# Patient Record
Sex: Male | Born: 2000 | Race: Black or African American | Hispanic: No | Marital: Single | Smoking: Never smoker
Health system: Southern US, Community
[De-identification: ages and names within clinical notes are randomized; demographics above are authoritative.]

## PROBLEM LIST (undated history)

## (undated) ENCOUNTER — Ambulatory Visit: Source: Home / Self Care

## (undated) DIAGNOSIS — J45909 Unspecified asthma, uncomplicated: Secondary | ICD-10-CM

## (undated) DIAGNOSIS — T7840XA Allergy, unspecified, initial encounter: Secondary | ICD-10-CM

## (undated) DIAGNOSIS — F909 Attention-deficit hyperactivity disorder, unspecified type: Secondary | ICD-10-CM

## (undated) HISTORY — DX: Allergy, unspecified, initial encounter: T78.40XA

## (undated) HISTORY — DX: Unspecified asthma, uncomplicated: J45.909

## (undated) HISTORY — DX: Attention-deficit hyperactivity disorder, unspecified type: F90.9

---
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
## 2021-01-13 DIAGNOSIS — Z03818 Encounter for observation for suspected exposure to other biological agents ruled out: Secondary | ICD-10-CM | POA: Diagnosis not present

## 2021-01-31 DIAGNOSIS — Z03818 Encounter for observation for suspected exposure to other biological agents ruled out: Secondary | ICD-10-CM | POA: Diagnosis not present

## 2021-02-15 DIAGNOSIS — Z20828 Contact with and (suspected) exposure to other viral communicable diseases: Secondary | ICD-10-CM | POA: Diagnosis not present

## 2021-03-13 DIAGNOSIS — J111 Influenza due to unidentified influenza virus with other respiratory manifestations: Secondary | ICD-10-CM | POA: Diagnosis not present

## 2021-04-17 DIAGNOSIS — J111 Influenza due to unidentified influenza virus with other respiratory manifestations: Secondary | ICD-10-CM | POA: Diagnosis not present

## 2021-05-14 DIAGNOSIS — J111 Influenza due to unidentified influenza virus with other respiratory manifestations: Secondary | ICD-10-CM | POA: Diagnosis not present

## 2021-07-16 ENCOUNTER — Encounter (HOSPITAL_COMMUNITY): Payer: Self-pay

## 2021-07-16 ENCOUNTER — Other Ambulatory Visit: Payer: Self-pay

## 2021-07-16 ENCOUNTER — Emergency Department (HOSPITAL_COMMUNITY)
Admission: EM | Admit: 2021-07-16 | Discharge: 2021-07-16 | Discharge status: Against Medical Advice | Payer: Medicaid Other | Attending: Emergency Medicine | Admitting: Emergency Medicine

## 2021-07-16 DIAGNOSIS — R0602 Shortness of breath: Secondary | ICD-10-CM | POA: Diagnosis not present

## 2021-07-16 DIAGNOSIS — Z5321 Procedure and treatment not carried out due to patient leaving prior to being seen by health care provider: Secondary | ICD-10-CM | POA: Diagnosis not present

## 2021-07-16 DIAGNOSIS — J45901 Unspecified asthma with (acute) exacerbation: Secondary | ICD-10-CM | POA: Diagnosis not present

## 2021-07-16 DIAGNOSIS — R509 Fever, unspecified: Secondary | ICD-10-CM | POA: Insufficient documentation

## 2021-07-16 DIAGNOSIS — R112 Nausea with vomiting, unspecified: Secondary | ICD-10-CM | POA: Diagnosis not present

## 2021-07-16 DIAGNOSIS — Z20822 Contact with and (suspected) exposure to covid-19: Secondary | ICD-10-CM | POA: Insufficient documentation

## 2021-07-16 LAB — RESP PANEL BY RT-PCR (FLU A&B, COVID) ARPGX2
Influenza A by PCR: NEGATIVE
Influenza B by PCR: NEGATIVE
SARS Coronavirus 2 by RT PCR: NEGATIVE

## 2021-07-16 MED ORDER — ALBUTEROL SULFATE HFA 108 (90 BASE) MCG/ACT IN AERS
2.0000 | INHALATION_SPRAY | RESPIRATORY_TRACT | Status: DC | PRN
  Administered 2021-07-16: 2 via RESPIRATORY_TRACT
  Filled 2021-07-16: qty 6.7

## 2021-07-16 NOTE — ED Triage Notes (Signed)
Pt reports fever/chills, vomiting, nausea onset 2 days ago and states is it making his asthma worse ?

## 2021-07-16 NOTE — ED Notes (Signed)
Pt left. Pt advised to stay. Pt stated he would come back tomorrow.  ?

## 2021-07-16 NOTE — ED Provider Triage Note (Signed)
Emergency Medicine Provider Triage Evaluation Note ? ?Wesley Conner , a 21 y.o. male  was evaluated in triage.  Pt complains of asthma exacerbation for the past two days. He reports some nausea and vomiting, some post-tussive emesis but not always. Denies any abdominal pain. Reports subjective fevers.  ? ?Review of Systems  ?Positive:  ?Negative:  ? ?Physical Exam  ?BP 116/86 (BP Location: Left Arm)   Pulse (!) 101   Temp 98.9 ?F (37.2 ?C) (Oral)   Resp 16   SpO2 97%  ?Gen:   Awake, no distress   ?Resp:  Normal effort, bilateral expiratory wheezing  ?MSK:   Moves extremities without difficulty  ?Other:   ? ?Medical Decision Making  ?Medically screening exam initiated at 8:46 PM.  Appropriate orders placed.  Wesley Conner was informed that the remainder of the evaluation will be completed by another provider, this initial triage assessment does not replace that evaluation, and the importance of remaining in the ED until their evaluation is complete. ? ?Likely asthma exacerbation from viral illness.COVID/flu ordered with albuterol. NAD. ?  ?Sherrell Puller, PA-C ?07/16/21 2048 ? ?

## 2021-07-25 IMAGING — DX DG ANKLE COMPLETE 3+V*L*
3 series · 3 of 3 positions shown · non-contrast
Comparison: None.

CLINICAL DATA: Ankle sprain while playing basketball. Lateral pain.

EXAM:
LEFT ANKLE COMPLETE - 3+ VIEW

[ankle ap]
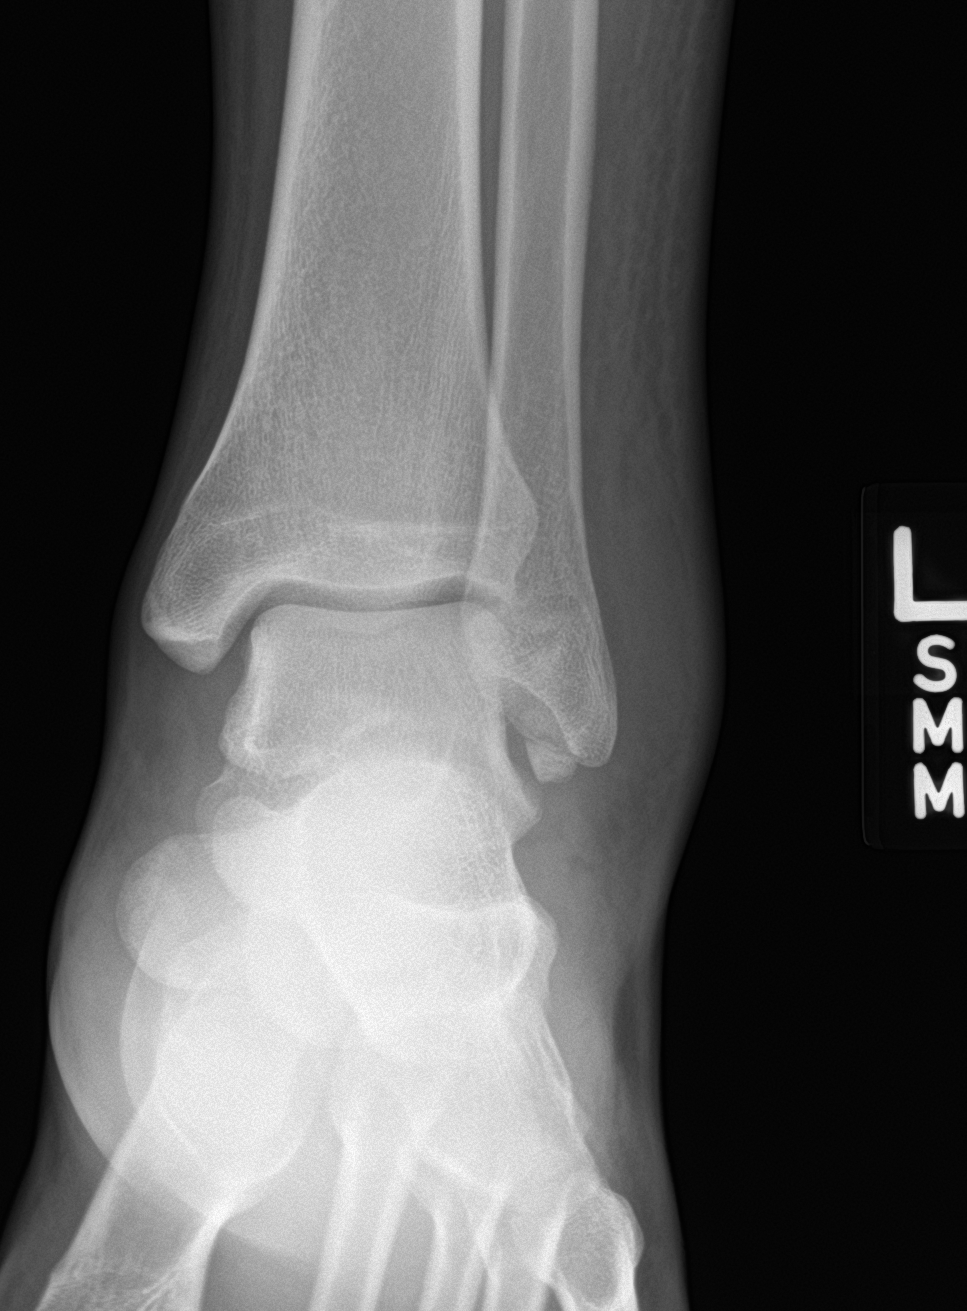

[ankle obl]
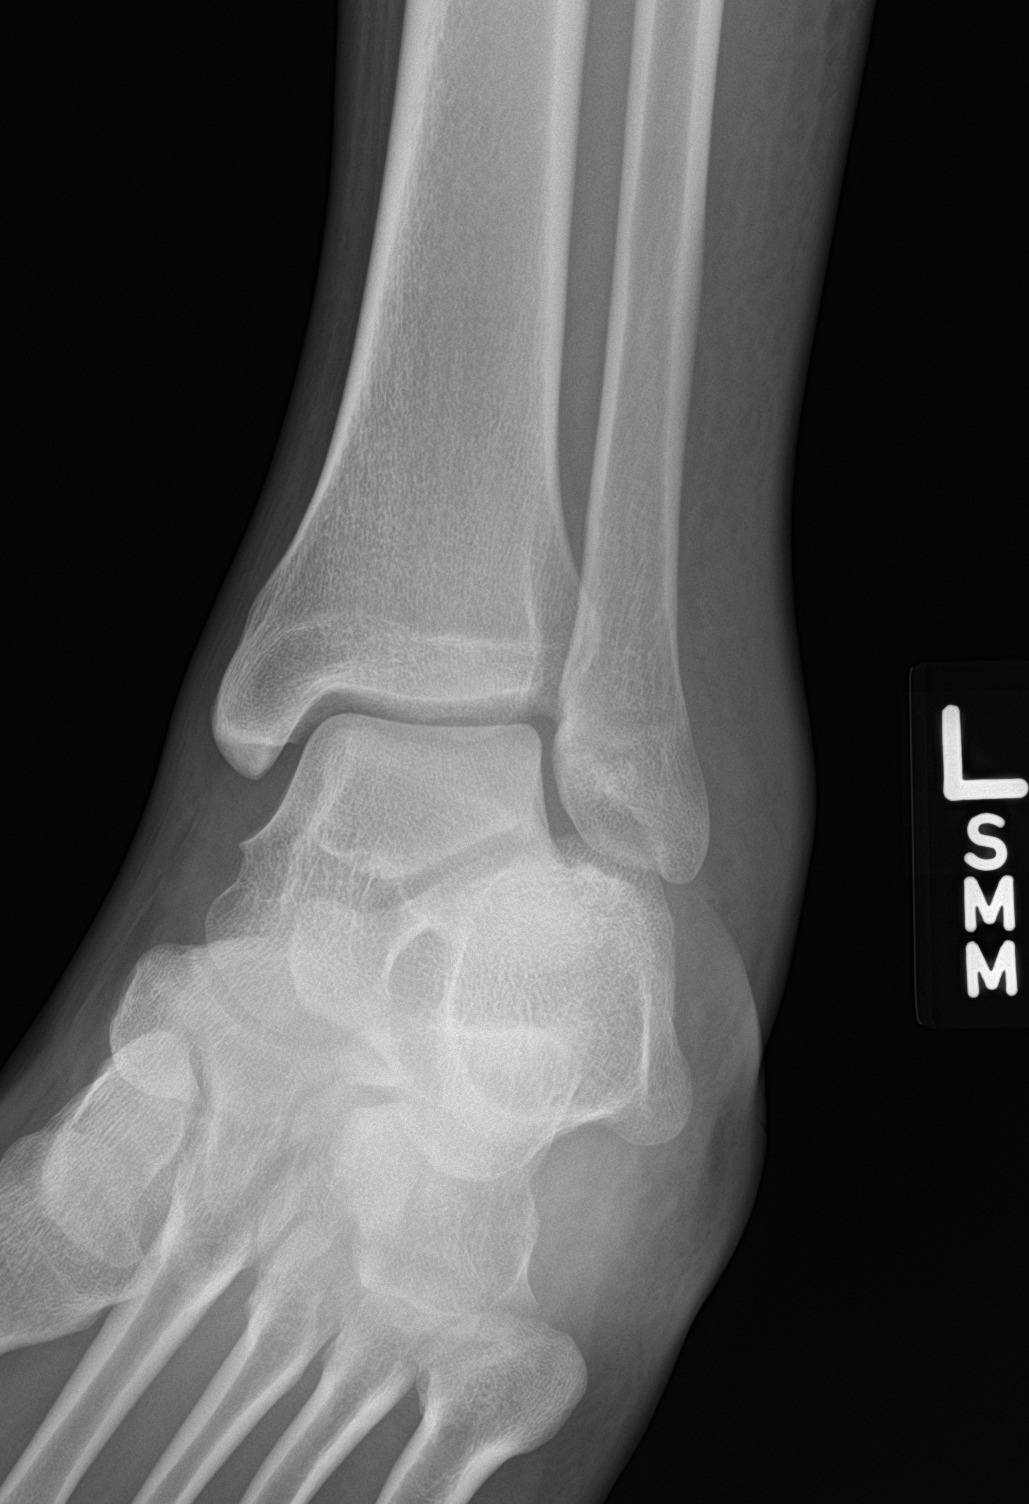

[ankle lat]
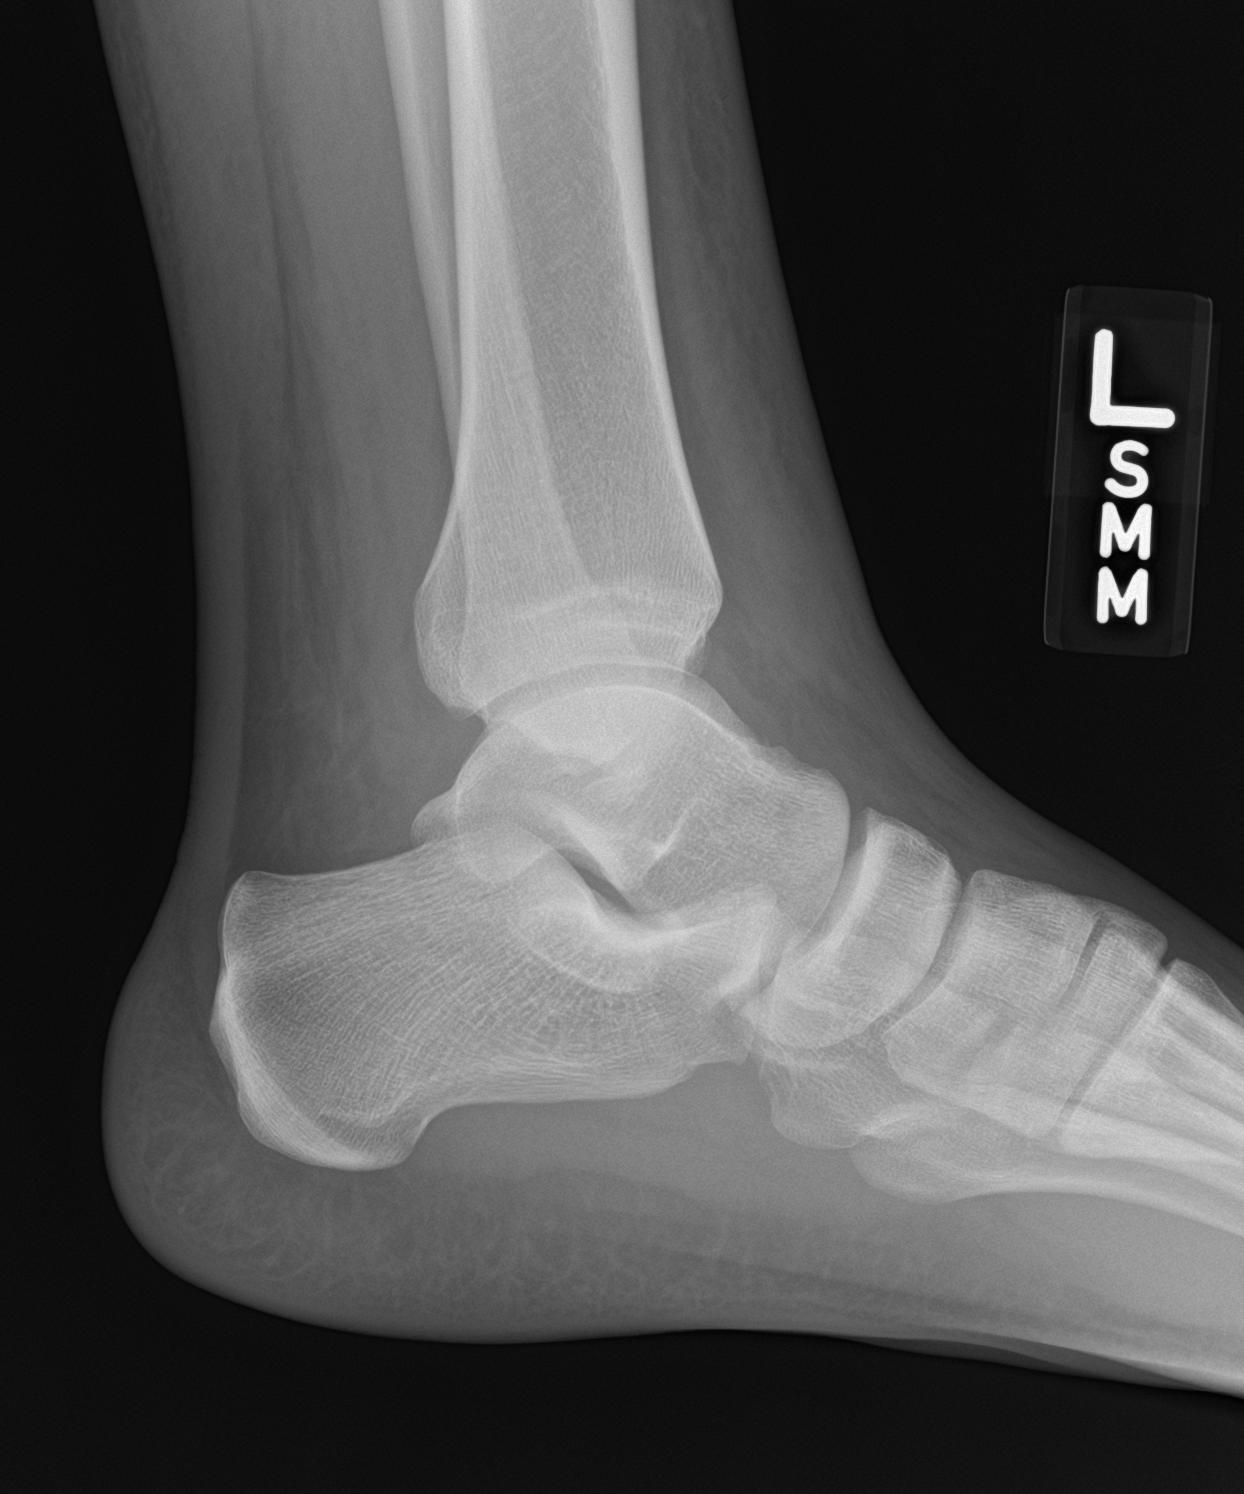

[3 of 3 positions shown; findings below may reference images not displayed]

FINDINGS: Nondisplaced fracture of the distal tip of the lateral malleolus
with overlying soft tissue swelling. No other fracture or
dislocation. No aggressive osseous lesion.
IMPRESSION: Nondisplaced fracture of the distal tip of the lateral malleolus
with overlying soft tissue swelling.

## 2021-08-12 ENCOUNTER — Encounter: Payer: Self-pay | Admitting: Pediatrics

## 2021-08-12 ENCOUNTER — Telehealth: Payer: Self-pay

## 2021-08-12 NOTE — Telephone Encounter (Signed)
Called to schedule for PE/asthma f/u but no answer and voice mail was not available.

## 2021-10-19 ENCOUNTER — Other Ambulatory Visit (HOSPITAL_COMMUNITY): Payer: Self-pay

## 2021-10-19 ENCOUNTER — Emergency Department (HOSPITAL_COMMUNITY)
Admission: EM | Admit: 2021-10-19 | Discharge: 2021-10-19 | Disposition: A | Discharge status: Home / Self Care | Payer: Medicaid Other | Attending: Emergency Medicine | Admitting: Emergency Medicine

## 2021-10-19 ENCOUNTER — Emergency Department (HOSPITAL_COMMUNITY): Payer: Medicaid Other

## 2021-10-19 ENCOUNTER — Encounter (HOSPITAL_COMMUNITY): Payer: Self-pay | Admitting: Emergency Medicine

## 2021-10-19 DIAGNOSIS — R112 Nausea with vomiting, unspecified: Secondary | ICD-10-CM

## 2021-10-19 DIAGNOSIS — R824 Acetonuria: Secondary | ICD-10-CM | POA: Insufficient documentation

## 2021-10-19 DIAGNOSIS — I10 Essential (primary) hypertension: Secondary | ICD-10-CM | POA: Diagnosis not present

## 2021-10-19 DIAGNOSIS — R110 Nausea: Secondary | ICD-10-CM | POA: Diagnosis not present

## 2021-10-19 DIAGNOSIS — J45909 Unspecified asthma, uncomplicated: Secondary | ICD-10-CM | POA: Diagnosis not present

## 2021-10-19 DIAGNOSIS — R809 Proteinuria, unspecified: Secondary | ICD-10-CM | POA: Diagnosis not present

## 2021-10-19 DIAGNOSIS — R1111 Vomiting without nausea: Secondary | ICD-10-CM | POA: Diagnosis not present

## 2021-10-19 DIAGNOSIS — E8809 Other disorders of plasma-protein metabolism, not elsewhere classified: Secondary | ICD-10-CM | POA: Diagnosis not present

## 2021-10-19 DIAGNOSIS — R7981 Abnormal blood-gas level: Secondary | ICD-10-CM | POA: Diagnosis not present

## 2021-10-19 DIAGNOSIS — D72829 Elevated white blood cell count, unspecified: Secondary | ICD-10-CM | POA: Diagnosis not present

## 2021-10-19 DIAGNOSIS — R1084 Generalized abdominal pain: Secondary | ICD-10-CM | POA: Insufficient documentation

## 2021-10-19 DIAGNOSIS — R944 Abnormal results of kidney function studies: Secondary | ICD-10-CM | POA: Insufficient documentation

## 2021-10-19 DIAGNOSIS — R109 Unspecified abdominal pain: Secondary | ICD-10-CM | POA: Diagnosis not present

## 2021-10-19 LAB — COMPREHENSIVE METABOLIC PANEL
ALT: 24 U/L (ref 0–44)
AST: 36 U/L (ref 15–41)
Albumin: 5.2 g/dL — ABNORMAL HIGH (ref 3.5–5.0)
Alkaline Phosphatase: 65 U/L (ref 38–126)
Anion gap: 17 — ABNORMAL HIGH (ref 5–15)
BUN: 25 mg/dL — ABNORMAL HIGH (ref 6–20)
CO2: 21 mmol/L — ABNORMAL LOW (ref 22–32)
Calcium: 9.9 mg/dL (ref 8.9–10.3)
Chloride: 104 mmol/L (ref 98–111)
Creatinine, Ser: 0.99 mg/dL (ref 0.61–1.24)
GFR, Estimated: >60 mL/min (ref >60)
Glucose, Bld: 83 mg/dL (ref 70–99)
Potassium: 4.6 mmol/L (ref 3.5–5.1)
Sodium: 142 mmol/L (ref 135–145)
Total Bilirubin: 1.2 mg/dL (ref 0.3–1.2)
Total Protein: 9.2 g/dL — ABNORMAL HIGH (ref 6.5–8.1)

## 2021-10-19 LAB — CBC WITH DIFFERENTIAL/PLATELET
Abs Immature Granulocytes: 0.39 10*3/uL — ABNORMAL HIGH (ref 0.00–0.07)
Basophils Absolute: 0.1 10*3/uL (ref 0.0–0.1)
Basophils Relative: 0 %
Eosinophils Absolute: 0 10*3/uL (ref 0.0–0.5)
Eosinophils Relative: 0 %
HCT: 52.7 % — ABNORMAL HIGH (ref 39.0–52.0)
Hemoglobin: 17.3 g/dL — ABNORMAL HIGH (ref 13.0–17.0)
Immature Granulocytes: 2 %
Lymphocytes Relative: 8 %
Lymphs Abs: 1.5 10*3/uL (ref 0.7–4.0)
MCH: 29 pg (ref 26.0–34.0)
MCHC: 32.8 g/dL (ref 30.0–36.0)
MCV: 88.4 fL (ref 80.0–100.0)
Monocytes Absolute: 1 10*3/uL (ref 0.1–1.0)
Monocytes Relative: 6 %
Neutro Abs: 15 10*3/uL — ABNORMAL HIGH (ref 1.7–7.7)
Neutrophils Relative %: 84 %
Platelets: 257 10*3/uL (ref 150–400)
RBC: 5.96 MIL/uL — ABNORMAL HIGH (ref 4.22–5.81)
RDW: 11.9 % (ref 11.5–15.5)
WBC: 18 10*3/uL — ABNORMAL HIGH (ref 4.0–10.5)
nRBC: 0 % (ref 0.0–0.2)

## 2021-10-19 LAB — URINALYSIS, ROUTINE W REFLEX MICROSCOPIC
Bilirubin Urine: NEGATIVE
Glucose, UA: NEGATIVE mg/dL
Hgb urine dipstick: NEGATIVE
Ketones, ur: 80 mg/dL — AB
Leukocytes,Ua: NEGATIVE
Nitrite: NEGATIVE
Protein, ur: 100 mg/dL — AB
Specific Gravity, Urine: 1.024 (ref 1.005–1.030)
pH: 5 (ref 5.0–8.0)

## 2021-10-19 LAB — ETHANOL: Alcohol, Ethyl (B): <10 mg/dL (ref <10)

## 2021-10-19 LAB — LIPASE, BLOOD: Lipase: 23 U/L (ref 11–51)

## 2021-10-19 MED ORDER — MORPHINE SULFATE (PF) 2 MG/ML IV SOLN
2.0000 mg | Freq: Once | INTRAVENOUS | Status: AC
  Administered 2021-10-19: 2 mg via INTRAVENOUS
  Filled 2021-10-19: qty 1

## 2021-10-19 MED ORDER — SODIUM CHLORIDE 0.9 % IV BOLUS
1000.0000 mL | Freq: Once | INTRAVENOUS | Status: AC
  Administered 2021-10-19: 1000 mL via INTRAVENOUS

## 2021-10-19 MED ORDER — IOHEXOL 300 MG/ML  SOLN
100.0000 mL | Freq: Once | INTRAMUSCULAR | Status: AC | PRN
  Administered 2021-10-19: 80 mL via INTRAVENOUS

## 2021-10-19 MED ORDER — ONDANSETRON 4 MG PO TBDP
4.0000 mg | ORAL_TABLET | Freq: Three times a day (TID) | ORAL | 0 refills | Status: AC | PRN
  Filled 2021-10-19: qty 20, 7d supply, fill #0

## 2021-10-19 MED ORDER — IBUPROFEN 800 MG PO TABS
800.0000 mg | ORAL_TABLET | Freq: Once | ORAL | Status: AC
  Administered 2021-10-19: 800 mg via ORAL
  Filled 2021-10-19: qty 1

## 2021-10-19 MED ORDER — ONDANSETRON HCL 4 MG/2ML IJ SOLN
4.0000 mg | Freq: Once | INTRAMUSCULAR | Status: AC
  Administered 2021-10-19: 4 mg via INTRAVENOUS
  Filled 2021-10-19: qty 2

## 2021-10-19 NOTE — ED Provider Notes (Signed)
[facility]-EMERGENCY DEPT Provider Note   CSN: [id number] Arrival date & time: 10/19/21  0654     History  Chief Complaint  Patient presents with   Abdominal Pain    Wesley Conner is a 21 y.o. male.  Patient with history of ADHD and asthma presents today with complaints of abdominal pain, nausea, and vomiting. He states that yesterday he had a bottle of vodka, a bottle of tequila, and 2 beers throughout the day yesterday. States that he doesn't normally drink alcohol. After several hours of drinking he developed symptoms that have been persistent since onset. Denies any hematemesis. Abdominal pain is nonlocalized throughout his abdomen. Denies any hx of abdominal surgeries.  The history is provided by the patient. No language interpreter was used.  Abdominal Pain Associated symptoms: nausea and vomiting        Home Medications Prior to Admission medications   Medication Sig Start Date End Date Taking? Authorizing Provider  albuterol (VENTOLIN HFA) 108 (90 Base) MCG/ACT inhaler Inhale 2 puffs into the lungs every 4 (four) hours as needed for wheezing or shortness of breath. Please dispense brand covered by insurance 09/19/20   Maree Erie, MD  cetirizine (ZYRTEC) 10 MG tablet TAKE ONE TABLET BY MOUTH ONCE DAILY AT BEDTIME FOR ALLERGY SYMPTOM CONTROL 11/12/19   Maree Erie, MD  EPINEPHrine 0.3 mg/0.3 mL IJ SOAJ injection Inject contents of one device (0.3 ml) into lateral thigh muscle in event of anaphylaxis 01/12/17   Maree Erie, MD  fluticasone (FLONASE) 50 MCG/ACT nasal spray TAKE 1 SPRAY IN EACH NOSTRIL 1XDAY. RINSE MOUTH+SPIT AFTER USE 09/19/20   Maree Erie, MD  Olopatadine HCl (PATADAY) 0.2 % SOLN Put one drop in affected eye once a day for allergy symptom control 09/19/20   Maree Erie, MD      Allergies    Shellfish allergy    Review of Systems   Review of Systems  Gastrointestinal:  Positive for abdominal pain, nausea and  vomiting.  All other systems reviewed and are negative.   Physical Exam Updated Vital Signs BP 132/82 (BP Location: Right Arm)   Pulse 70   Temp 97.9 F (36.6 C) (Oral)   Resp 18   SpO2 96%  Physical Exam Vitals and nursing note reviewed.  Constitutional:      General: He is not in acute distress.    Appearance: Normal appearance. He is normal weight. He is not ill-appearing, toxic-appearing or diaphoretic.  HENT:     Head: Normocephalic and atraumatic.  Cardiovascular:     Rate and Rhythm: Normal rate.  Pulmonary:     Effort: Pulmonary effort is normal. No respiratory distress.  Abdominal:     General: Abdomen is flat.     Palpations: Abdomen is soft.     Tenderness: There is generalized abdominal tenderness.  Musculoskeletal:        General: Normal range of motion.     Cervical back: Normal range of motion.  Skin:    General: Skin is warm and dry.  Neurological:     General: No focal deficit present.     Mental Status: He is alert.  Psychiatric:        Mood and Affect: Mood normal.        Behavior: Behavior normal.     ED Results / Procedures / Treatments   Labs (all labs ordered are listed, but only abnormal results are displayed) Labs Reviewed  CBC WITH DIFFERENTIAL/PLATELET - Abnormal;  Notable for the following components:      Result Value   WBC 18.0 (*)    RBC 5.96 (*)    Hemoglobin 17.3 (*)    HCT 52.7 (*)    Neutro Abs 15.0 (*)    Abs Immature Granulocytes 0.39 (*)    All other components within normal limits  URINALYSIS, ROUTINE W REFLEX MICROSCOPIC - Abnormal; Notable for the following components:   Ketones, ur 80 (*)    Protein, ur 100 (*)    Bacteria, UA RARE (*)    All other components within normal limits  COMPREHENSIVE METABOLIC PANEL  LIPASE, BLOOD  ETHANOL    EKG None  Radiology CT ABDOMEN PELVIS W CONTRAST  Result Date: 10/19/2021 CLINICAL DATA:  21 year old male with abdominal pain since last night. EXAM: CT ABDOMEN AND PELVIS  WITH CONTRAST TECHNIQUE: Multidetector CT imaging of the abdomen and pelvis was performed using the standard protocol following bolus administration of intravenous contrast. RADIATION DOSE REDUCTION: This exam was performed according to the departmental dose-optimization program which includes automated exposure control, adjustment of the mA and/or kV according to patient size and/or use of iterative reconstruction technique. CONTRAST:  69mL OMNIPAQUE IOHEXOL 300 MG/ML  SOLN COMPARISON:  None Available. FINDINGS: Lower chest: Negative. Hepatobiliary: Negative liver and gallbladder. Pancreas: Negative. Spleen: Negative. Adrenals/Urinary Tract: Negative. Renal enhancement symmetric and within normal limits. Stomach/Bowel: Mostly decompressed large bowel throughout the abdomen and pelvis. Normal appendix, terminates in the right presacral space coronal image 82. No dilated small bowel. Stomach and duodenum appear negative. No free air or free fluid. Vascular/Lymphatic: Major arterial structures and portal venous system in the abdomen and pelvis appear patent, unremarkable. No calcified atherosclerosis lymphadenopathy identified. Reproductive: Negative. Other: No pelvic free fluid. Musculoskeletal: Negative. IMPRESSION: Essentially negative. No acute or inflammatory process identified in the abdomen or pelvis. Electronically Signed   By: Odessa Fleming M.D.   On: 10/19/2021 11:04    Procedures Procedures    Medications Ordered in ED Medications  sodium chloride 0.9 % bolus 1,000 mL (0 mLs Intravenous Stopped 10/19/21 0843)  ondansetron (ZOFRAN) injection 4 mg (4 mg Intravenous Given 10/19/21 0804)  morphine (PF) 2 MG/ML injection 2 mg (2 mg Intravenous Given 10/19/21 0803)    ED Course/ Medical Decision Making/ A&P                           Medical Decision Making Amount and/or Complexity of Data Reviewed Labs: ordered. Radiology: ordered.  Risk Prescription drug management.   This patient presents to the  ED for concern of abdominal pain, nausea, and vomiting, this involves an extensive number of treatment options, and is a complaint that carries with it a high risk of complications and morbidity.    Co morbidities that complicate the patient evaluation  none   Lab Tests:  I Ordered, and personally interpreted labs.  The pertinent results include:  bicarb 21, BUN 25, total protein 9.2, albumin 5.2, anion gap 17. WBC 18. Ketonuria and proteinuria   Imaging Studies ordered:  I ordered imaging studies including CT abdomen pelvis with contrast  I independently visualized and interpreted imaging which showed no acute findings I agree with the radiologist interpretation   Problem List / ED Course / Critical interventions / Medication management  I ordered medication including ibuprofen, morphine, zofran, and fluids  for abdominal pain, nausea and vomiting  Reevaluation of the patient after these medicines showed that the patient improved I  have reviewed the patients home medicines and have made adjustments as needed  Test / Admission - Considered:  Patient is nontoxic, nonseptic appearing, in no apparent distress.  Patient's pain and other symptoms adequately managed in emergency department.  Fluid bolus given.  Labs, imaging and vitals reviewed.  Patient does not meet the SIRS or Sepsis criteria.  On repeat exam patient does not have a surgical abdomin and there are no peritoneal signs.  No indication of appendicitis, bowel obstruction, bowel perforation, cholecystitis, diverticulitis. Suspect symptoms related to patients significant alcohol ingestion yesterday. Patient discharged home with Zofran for symptomatic treatment and given strict instructions for follow-up with their primary care physician.  I have also discussed reasons to return immediately to the ER.  Patient expresses understanding and agrees with plan. Discharged in stable condition.   Final Clinical Impression(s) / ED  Diagnoses Final diagnoses:  Nausea and vomiting, unspecified vomiting type  Generalized abdominal pain    Rx / DC Orders ED Discharge Orders          Ordered    ondansetron (ZOFRAN-ODT) 4 MG disintegrating tablet  Every 8 hours PRN        10/19/21 1131          An After Visit Summary was printed and given to the patient.     Vear Clock 10/19/21 1134    Linwood Dibbles, MD 10/19/21 [phone]

## 2021-10-19 NOTE — ED Notes (Signed)
Pt ambulatory without assistance.  

## 2021-10-19 NOTE — Discharge Instructions (Signed)
As we discussed, your work-up in the ER was reassuring for acute abnormalities. Laboratory evaluation and CT imaging did not reveal any emergent concerns. Given this, I suspect your symptoms were related to alcohol intoxication. I have given you a prescription for zofran which is an anti-nausea medication to take as prescribed as needed. I recommend you drink plenty of fluids over the next few days to ensure you are maintaining adequate hydration. Follow-up with your PCP in the next few days as needed for continued management of your symptoms.  Return if development of any new or worsening symptoms

## 2021-10-19 NOTE — ED Notes (Signed)
Urine sample has been collected and sitting in triage nursing station. 

## 2021-10-19 NOTE — ED Triage Notes (Signed)
Pt BIB [facility] from home. Reports abdominal pain since last night. Drank alcohol last night and states that he doesn't usually drink. Endorses N/V.

## 2021-11-05 ENCOUNTER — Telehealth: Payer: Self-pay

## 2021-11-05 DIAGNOSIS — Z09 Encounter for follow-up examination after completed treatment for conditions other than malignant neoplasm: Secondary | ICD-10-CM

## 2021-11-05 NOTE — Telephone Encounter (Signed)
SWCM Mailed pt Adolescent Transition letter, mychart message also sent to pt offering one last if needing med refill and to discuss transition.    Kenn File, BSW, QP Social Work Case Interior and spatial designer and [employer] [facility] Office: [phone] Direct Number: [phone]

## 2022-05-31 ENCOUNTER — Emergency Department (HOSPITAL_COMMUNITY)
Admission: EM | Admit: 2022-05-31 | Discharge: 2022-05-31 | Disposition: A | Discharge status: Home / Self Care | Payer: Medicaid Other | Attending: Emergency Medicine | Admitting: Emergency Medicine

## 2022-05-31 ENCOUNTER — Encounter (HOSPITAL_COMMUNITY): Payer: Self-pay | Admitting: Emergency Medicine

## 2022-05-31 ENCOUNTER — Other Ambulatory Visit: Payer: Self-pay

## 2022-05-31 DIAGNOSIS — R0602 Shortness of breath: Secondary | ICD-10-CM | POA: Diagnosis not present

## 2022-05-31 DIAGNOSIS — J45901 Unspecified asthma with (acute) exacerbation: Secondary | ICD-10-CM | POA: Diagnosis not present

## 2022-05-31 DIAGNOSIS — Z7951 Long term (current) use of inhaled steroids: Secondary | ICD-10-CM | POA: Insufficient documentation

## 2022-05-31 DIAGNOSIS — J4540 Moderate persistent asthma, uncomplicated: Secondary | ICD-10-CM

## 2022-05-31 MED ORDER — IPRATROPIUM BROMIDE 0.02 % IN SOLN
0.5000 mg | Freq: Once | RESPIRATORY_TRACT | Status: AC
  Administered 2022-05-31: 0.5 mg via RESPIRATORY_TRACT
  Filled 2022-05-31: qty 2.5

## 2022-05-31 MED ORDER — DEXAMETHASONE 4 MG PO TABS
10.0000 mg | ORAL_TABLET | Freq: Once | ORAL | Status: AC
  Administered 2022-05-31: 10 mg via ORAL
  Filled 2022-05-31: qty 3

## 2022-05-31 MED ORDER — ALBUTEROL SULFATE (2.5 MG/3ML) 0.083% IN NEBU
5.0000 mg | INHALATION_SOLUTION | Freq: Once | RESPIRATORY_TRACT | Status: AC
  Administered 2022-05-31: 5 mg via RESPIRATORY_TRACT
  Filled 2022-05-31: qty 6

## 2022-05-31 MED ORDER — ALBUTEROL SULFATE HFA 108 (90 BASE) MCG/ACT IN AERS: 2.0000 | INHALATION_SPRAY | RESPIRATORY_TRACT | 2 refills | Status: AC | PRN

## 2022-05-31 MED ORDER — ALBUTEROL SULFATE (2.5 MG/3ML) 0.083% IN NEBU: 2.5000 mg | INHALATION_SOLUTION | Freq: Once | RESPIRATORY_TRACT | Status: DC

## 2022-05-31 MED ORDER — ALBUTEROL SULFATE HFA 108 (90 BASE) MCG/ACT IN AERS
2.0000 | INHALATION_SPRAY | Freq: Once | RESPIRATORY_TRACT | Status: AC
  Administered 2022-05-31: 2 via RESPIRATORY_TRACT
  Filled 2022-05-31: qty 6.7

## 2022-05-31 NOTE — ED Triage Notes (Signed)
Pt here from home with an asthma flare up , has been out of his inhaler and nebs at home ,

## 2022-05-31 NOTE — ED Provider Notes (Signed)
[facility] Provider Note   CSN: [id number] Arrival date & time: 05/31/22  0141     History  Chief Complaint  Patient presents with   Shortness of Breath    Becky Janelle is a 22 y.o. male.  22 year old male with a history of asthma presents to the emergency department for shortness of breath.  Reports that his symptoms began yesterday and worsened this morning.  He has been out of his albuterol inhaler and nebulizers for the past year.  Denies any associated sick contacts, fever, hemoptysis, leg swelling.  No interventions attempted prior to arrival for symptom management.  The history is provided by the patient. No language interpreter was used.  Shortness of Breath      Home Medications Prior to Admission medications   Medication Sig Start Date End Date Taking? Authorizing Provider  albuterol (VENTOLIN HFA) 108 (90 Base) MCG/ACT inhaler Inhale 2 puffs into the lungs every 4 (four) hours as needed for wheezing or shortness of breath. Please dispense brand covered by insurance 05/31/22   Antonietta Breach, PA-C  cetirizine (ZYRTEC) 10 MG tablet TAKE ONE TABLET BY MOUTH ONCE DAILY AT BEDTIME FOR ALLERGY SYMPTOM CONTROL 11/12/19   Lurlean Leyden, MD  EPINEPHrine 0.3 mg/0.3 mL IJ SOAJ injection Inject contents of one device (0.3 ml) into lateral thigh muscle in event of anaphylaxis 01/12/17   Lurlean Leyden, MD  fluticasone (FLONASE) 50 MCG/ACT nasal spray TAKE 1 SPRAY IN EACH NOSTRIL 1XDAY. RINSE MOUTH+SPIT AFTER USE 09/19/20   Lurlean Leyden, MD  Olopatadine HCl (PATADAY) 0.2 % SOLN Put one drop in affected eye once a day for allergy symptom control 09/19/20   Lurlean Leyden, MD  ondansetron (ZOFRAN-ODT) 4 MG disintegrating tablet Allow 1 tablet to dissolve by mouth every 8 hours as needed for nausea or vomiting. 10/19/21   Smoot, Leary Roca, PA-C      Allergies    Shellfish allergy    Review of Systems   Review of Systems   Respiratory:  Positive for shortness of breath.   Ten systems reviewed and are negative for acute change, except as noted in the HPI.    Physical Exam Updated Vital Signs BP (!) 133/105   Pulse 81   Temp 98.5 F (36.9 C) (Oral)   Resp 18   SpO2 95%   Physical Exam Vitals and nursing note reviewed.  Constitutional:      General: He is not in acute distress.    Appearance: He is well-developed. He is not diaphoretic.  HENT:     Head: Normocephalic and atraumatic.  Eyes:     General: No scleral icterus.    Conjunctiva/sclera: Conjunctivae normal.  Pulmonary:     Effort: Pulmonary effort is normal. No respiratory distress.     Breath sounds: No stridor. Wheezing present.     Comments: Expiratory wheezing noted in all lung fields.  Patient speaking in full sentences.  No respiratory distress or accessory muscle use. Musculoskeletal:        General: Normal range of motion.     Cervical back: Normal range of motion.  Skin:    General: Skin is warm and dry.     Coloration: Skin is not pale.     Findings: No erythema or rash.  Neurological:     Mental Status: He is alert and oriented to person, place, and time.     Coordination: Coordination normal.  Psychiatric:  Behavior: Behavior normal.     ED Results / Procedures / Treatments   Labs (all labs ordered are listed, but only abnormal results are displayed) Labs Reviewed - No data to display  EKG None  Radiology No results found.  Procedures Procedures    Medications Ordered in ED Medications  albuterol (VENTOLIN HFA) 108 (90 Base) MCG/ACT inhaler 2 puff (has no administration in time range)  dexamethasone (DECADRON) tablet 10 mg (10 mg Oral Given 05/31/22 0311)  albuterol (PROVENTIL) (2.5 MG/3ML) 0.083% nebulizer solution 5 mg (5 mg Nebulization Given 05/31/22 0312)  ipratropium (ATROVENT) nebulizer solution 0.5 mg (0.5 mg Nebulization Given 05/31/22 [id number])    ED Course/ Medical Decision Making/  A&P Clinical Course as of 05/31/22 0409  Mon May 31, 2022  0402 Lungs clear to auscultation on repeat assessment.  Patient states that his SOB has greatly improved. [KH]    Clinical Course User Index [KH] Antonietta Breach, PA-C                             Medical Decision Making Risk Prescription drug management.   This patient presents to the ED for concern of SOB, this involves an extensive number of treatment options, and is a complaint that carries with it a high risk of complications and morbidity.  The differential diagnosis includes asthma exacerbation vs viral illness vs PNA vs PTX   Co morbidities that complicate the patient evaluation  Asthma    Cardiac Monitoring:  The patient was maintained on a cardiac monitor.  I personally viewed and interpreted the cardiac monitored which showed an underlying rhythm of: NSR   Medicines ordered and prescription drug management:  I ordered medication including decadron and Duoneb for wheezing/asthma exacerbation  Reevaluation of the patient after these medicines showed that the patient improved I have reviewed the patients home medicines and have made adjustments as needed   Test Considered:  CXR -however, patient without tachypnea, dyspnea, hypoxia in the ED.  No fever to suspect infectious etiology.  Lung sounds present in all fields.  No associated pleuritic chest pain.  Doubt pneumothorax.   Problem List / ED Course:  Patient with history of asthma presenting with wheezing in all lung fields.  Symptoms improved following Decadron as well as DuoNeb.  He has no hypoxia or signs of respiratory distress.  No fever to suggest infectious etiology.  Doubt pneumonia.  Also no associated pleuritic chest pain with hypoxia to suggest pneumothorax.  Patient states that symptoms feel consistent with prior asthma exacerbations.   Reevaluation:  After the interventions noted above, I reevaluated the patient and found that they have  :improved   Social Determinants of Health:  Lack of PCP follow up   Dispostion:  After consideration of the diagnostic results and the patients response to treatment, I feel that the patent would benefit from outpatient primary care follow-up.  Given inhaler to use for continued symptom management. Return precautions discussed and provided. Patient discharged in stable condition with no unaddressed concerns.          Final Clinical Impression(s) / ED Diagnoses Final diagnoses:  Exacerbation of asthma, unspecified asthma severity, unspecified whether persistent    Rx / DC Orders ED Discharge Orders          Ordered    albuterol (VENTOLIN HFA) 108 (90 Base) MCG/ACT inhaler  Every 4 hours PRN        05/31/22 0403  Antonietta Breach, PA-C 05/31/22 [id number]    Merryl Hacker, MD 06/01/22 [phone]

## 2022-05-31 NOTE — Discharge Instructions (Addendum)
Use 2 puffs of an albuterol inhaler every 4-6 hours for management of cough, wheezing, shortness of breath. Follow up with a primary care doctor.

## 2023-01-25 ENCOUNTER — Inpatient Hospital Stay (HOSPITAL_COMMUNITY)
Admission: RE | Admit: 2023-01-25 | Discharge: 2023-01-25 | Disposition: A | Discharge status: Home / Self Care | Payer: Medicaid Other | Source: Ambulatory Visit

## 2023-02-23 DIAGNOSIS — J111 Influenza due to unidentified influenza virus with other respiratory manifestations: Secondary | ICD-10-CM | POA: Diagnosis not present

## 2023-05-27 ENCOUNTER — Emergency Department (HOSPITAL_BASED_OUTPATIENT_CLINIC_OR_DEPARTMENT_OTHER)
Admission: EM | Admit: 2023-05-27 | Discharge: 2023-05-27 | Disposition: A | Discharge status: Home / Self Care | Payer: Medicaid Other | Attending: Emergency Medicine | Admitting: Emergency Medicine

## 2023-05-27 ENCOUNTER — Other Ambulatory Visit: Payer: Self-pay

## 2023-05-27 ENCOUNTER — Encounter (HOSPITAL_BASED_OUTPATIENT_CLINIC_OR_DEPARTMENT_OTHER): Payer: Self-pay

## 2023-05-27 DIAGNOSIS — R112 Nausea with vomiting, unspecified: Secondary | ICD-10-CM | POA: Insufficient documentation

## 2023-05-27 DIAGNOSIS — R1084 Generalized abdominal pain: Secondary | ICD-10-CM | POA: Diagnosis not present

## 2023-05-27 DIAGNOSIS — R197 Diarrhea, unspecified: Secondary | ICD-10-CM | POA: Insufficient documentation

## 2023-05-27 DIAGNOSIS — R110 Nausea: Secondary | ICD-10-CM | POA: Diagnosis not present

## 2023-05-27 DIAGNOSIS — R1111 Vomiting without nausea: Secondary | ICD-10-CM | POA: Diagnosis not present

## 2023-05-27 LAB — BASIC METABOLIC PANEL
Anion gap: 10 (ref 5–15)
BUN: 19 mg/dL (ref 6–20)
CO2: 23 mmol/L (ref 22–32)
Calcium: 10 mg/dL (ref 8.9–10.3)
Chloride: 106 mmol/L (ref 98–111)
Creatinine, Ser: 0.96 mg/dL (ref 0.61–1.24)
GFR, Estimated: >60 mL/min (ref >60)
Glucose, Bld: 121 mg/dL — ABNORMAL HIGH (ref 70–99)
Potassium: 4.1 mmol/L (ref 3.5–5.1)
Sodium: 139 mmol/L (ref 135–145)

## 2023-05-27 LAB — RESP PANEL BY RT-PCR (RSV, FLU A&B, COVID)  RVPGX2
Influenza A by PCR: NEGATIVE
Influenza B by PCR: NEGATIVE
Resp Syncytial Virus by PCR: NEGATIVE
SARS Coronavirus 2 by RT PCR: NEGATIVE

## 2023-05-27 MED ORDER — ONDANSETRON 4 MG PO TBDP: ORAL_TABLET | ORAL | 0 refills | Status: AC

## 2023-05-27 MED ORDER — HALOPERIDOL LACTATE 5 MG/ML IJ SOLN
2.0000 mg | Freq: Once | INTRAMUSCULAR | Status: AC
  Administered 2023-05-27: 2 mg via INTRAVENOUS
  Filled 2023-05-27: qty 1

## 2023-05-27 NOTE — ED Provider Notes (Signed)
[place] EMERGENCY DEPARTMENT AT [facility] Provider Note   CSN: [id number] Arrival date & time: 05/27/23  4098     History  Chief Complaint  Patient presents with   Abdominal Pain    Wesley Conner is a 23 y.o. male.  The history is provided by the patient.  Abdominal Pain Pain location:  Generalized Pain quality: aching   Pain radiates to:  Does not radiate Pain severity:  Moderate Onset quality:  Sudden Timing:  Constant Chronicity:  New Context: retching   Relieved by:  Nothing Worsened by:  Nothing Ineffective treatments:  None tried Associated symptoms: diarrhea, nausea and vomiting   Associated symptoms: no cough, no fever and no shortness of breath   Risk factors comment:  Sick contact with same Patient with onset vomiting then diarrhea and abdominal cramping x 9 hours.  Son with same.       Home Medications Prior to Admission medications   Medication Sig Start Date End Date Taking? Authorizing Provider  albuterol (VENTOLIN HFA) 108 (90 Base) MCG/ACT inhaler Inhale 2 puffs into the lungs every 4 (four) hours as needed for wheezing or shortness of breath. Please dispense brand covered by insurance 05/31/22   Antony Madura, PA-C  cetirizine (ZYRTEC) 10 MG tablet TAKE ONE TABLET BY MOUTH ONCE DAILY AT BEDTIME FOR ALLERGY SYMPTOM CONTROL 11/12/19   Maree Erie, MD  EPINEPHrine 0.3 mg/0.3 mL IJ SOAJ injection Inject contents of one device (0.3 ml) into lateral thigh muscle in event of anaphylaxis 01/12/17   Maree Erie, MD  fluticasone (FLONASE) 50 MCG/ACT nasal spray TAKE 1 SPRAY IN EACH NOSTRIL 1XDAY. RINSE MOUTH+SPIT AFTER USE 09/19/20   Maree Erie, MD  Olopatadine HCl (PATADAY) 0.2 % SOLN Put one drop in affected eye once a day for allergy symptom control 09/19/20   Maree Erie, MD  ondansetron (ZOFRAN-ODT) 4 MG disintegrating tablet Allow 1 tablet to dissolve by mouth every 8 hours as needed for nausea or vomiting. 10/19/21   Smoot,  Shawn Route, PA-C      Allergies    Shellfish allergy    Review of Systems   Review of Systems  Constitutional:  Negative for fever.  HENT:  Negative for facial swelling.   Respiratory:  Negative for cough and shortness of breath.   Gastrointestinal:  Positive for abdominal pain, diarrhea, nausea and vomiting.  All other systems reviewed and are negative.   Physical Exam Updated Vital Signs BP 131/87   Pulse 89   Temp 99 F (37.2 C) (Oral)   Resp 20   Ht 5\' 7"  (1.702 m)   Wt 65 kg   SpO2 99%   BMI 22.44 kg/m  Physical Exam Vitals and nursing note reviewed. Exam conducted with a chaperone present.  Constitutional:      General: He is not in acute distress.    Appearance: He is well-developed. He is not diaphoretic.  HENT:     Head: Normocephalic and atraumatic.     Nose: Nose normal.  Eyes:     Conjunctiva/sclera: Conjunctivae normal.     Pupils: Pupils are equal, round, and reactive to light.  Cardiovascular:     Rate and Rhythm: Normal rate and regular rhythm.     Pulses: Normal pulses.     Heart sounds: Normal heart sounds.  Pulmonary:     Effort: Pulmonary effort is normal.     Breath sounds: Normal breath sounds. No wheezing or rales.  Abdominal:  General: Bowel sounds are normal.     Palpations: Abdomen is soft.     Tenderness: There is no abdominal tenderness. There is no guarding or rebound. Negative signs include Murphy's sign, Rovsing's sign and McBurney's sign.     Hernia: No hernia is present.  Musculoskeletal:        General: Normal range of motion.     Cervical back: Normal range of motion and neck supple.  Skin:    General: Skin is warm and dry.     Capillary Refill: Capillary refill takes less than 2 seconds.  Neurological:     General: No focal deficit present.     Mental Status: He is alert and oriented to person, place, and time.     Deep Tendon Reflexes: Reflexes normal.  Psychiatric:        Mood and Affect: Mood normal.     ED  Results / Procedures / Treatments   Labs (all labs ordered are listed, but only abnormal results are displayed) Results for orders placed or performed during the hospital encounter of 05/27/23  Basic metabolic panel   Collection Time: 05/27/23  3:35 AM  Result Value Ref Range   Sodium 139 135 - 145 mmol/L   Potassium 4.1 3.5 - 5.1 mmol/L   Chloride 106 98 - 111 mmol/L   CO2 23 22 - 32 mmol/L   Glucose, Bld 121 (H) 70 - 99 mg/dL   BUN 19 6 - 20 mg/dL   Creatinine, Ser 1.61 0.61 - 1.24 mg/dL   Calcium 09.6 8.9 - 04.5 mg/dL   GFR, Estimated >40 >98 mL/min   Anion gap 10 5 - 15   No results found.   Radiology No results found.  Procedures Procedures    Medications Ordered in ED Medications  haloperidol lactate (HALDOL) injection 2 mg (2 mg Intravenous Given 05/27/23 1191)    ED Course/ Medical Decision Making/ A&P                                 Medical Decision Making Patient with sudden onset vomiting and diarrhea and pain   Amount and/or Complexity of Data Reviewed Independent Historian: EMS    Details: See above  External Data Reviewed: notes.    Details: Previous notes reviewed  Labs: ordered.    Details: Normal sodium 139, normal potassium 4.1, normal creatinine 0.96   Risk Prescription drug management. Risk Details: Well appearing, abdomen is non-tender.  I do not believe this is a surgical abdomen.  I do not believe this patient needs advanced imaging at this time.  Symptoms consistent with viral nausea vomiting and diarrhea. Resting comfortably.   PO challenged in the ED.  Stable for discharge     Final Clinical Impression(s) / ED Diagnoses Final diagnoses:  None   I have reviewed the triage vital signs and the nursing notes. Pertinent labs & imaging results that were available during my care of the patient were reviewed by me and considered in my medical decision making (see chart for details). After history, exam, and medical workup I feel the patient  has been appropriately medically screened and is safe for discharge home. Pertinent diagnoses were discussed with the patient. Patient was given return precautions.  Rx / DC Orders ED Discharge Orders     None         Glendia Olshefski, MD 05/27/23 4782

## 2023-05-27 NOTE — ED Triage Notes (Signed)
Pt BIB [facility] d/t N/V/D and mid ABD pain for 9 hours.  Pt had 4 mg zofran IV and 250 mLs LR by EMS.

## 2023-05-27 NOTE — ED Notes (Signed)
Nausea has improved, apple juice given to pt.

## 2023-06-22 DIAGNOSIS — J111 Influenza due to unidentified influenza virus with other respiratory manifestations: Secondary | ICD-10-CM | POA: Diagnosis not present

## 2024-02-27 ENCOUNTER — Ambulatory Visit
Admission: RE | Admit: 2024-02-27 | Discharge: 2024-02-27 | Disposition: A | Discharge status: Home / Self Care | Source: Ambulatory Visit

## 2024-02-27 VITALS — BP 142/82 | HR 102 | Temp 99.2°F | Resp 18 | Wt 143.3 lb

## 2024-02-27 DIAGNOSIS — J069 Acute upper respiratory infection, unspecified: Secondary | ICD-10-CM

## 2024-02-27 DIAGNOSIS — H9203 Otalgia, bilateral: Secondary | ICD-10-CM

## 2024-02-27 DIAGNOSIS — R070 Pain in throat: Secondary | ICD-10-CM

## 2024-02-27 LAB — POC COVID19/FLU A&B COMBO
Covid Antigen, POC: NEGATIVE
Influenza A Antigen, POC: NEGATIVE
Influenza B Antigen, POC: NEGATIVE

## 2024-02-27 LAB — POCT RAPID STREP A (OFFICE): Rapid Strep A Screen: NEGATIVE

## 2024-02-27 NOTE — ED Triage Notes (Signed)
 Pt presents c/o otalgia x 3 days. Pt states,  My ears hurts and my face is tender to touch around my ears.

## 2024-02-27 NOTE — ED Provider Notes (Signed)
 [facility]    CSN: [id number] Arrival date & time: 02/27/24  1648      History   Chief Complaint Chief Complaint  Patient presents with   Ear Fullness    Right ear sound has gotten muffled, plus stuffed sensation. - Entered by patient    HPI Wesley Conner is a 23 y.o. male.   Pt presents today due to 3 days of bilateral ear pain, throat pain, headache, and subjective fever. Pt states that he is eating and drinking without issue. Pt denies known sick contacts, pt denies use of medications for symptoms.   The history is provided by the patient.  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 No pertinent surgical history.     Home Medications    Prior to Admission medications   Medication Sig Start Date End Date Taking? Authorizing Provider  albuterol  (VENTOLIN  HFA) 108 (90 Base) MCG/ACT inhaler Inhale 2 puffs into the lungs every 4 (four) hours as needed for wheezing or shortness of breath. Please dispense brand covered by insurance 05/31/22   Keith Sor, PA-C  cetirizine  (ZYRTEC ) 10 MG tablet TAKE ONE TABLET BY MOUTH ONCE DAILY AT BEDTIME FOR ALLERGY SYMPTOM CONTROL 11/12/19   Taft Jon PARAS, MD  EPINEPHrine  0.3 mg/0.3 mL IJ SOAJ injection Inject contents of one device (0.3 ml) into lateral thigh muscle in event of anaphylaxis 01/12/17   Taft Jon PARAS, MD  fluticasone  (FLONASE ) 50 MCG/ACT nasal spray TAKE 1 SPRAY IN EACH NOSTRIL 1XDAY. RINSE MOUTH+SPIT AFTER USE 09/19/20   Taft Jon PARAS, MD   Olopatadine  HCl (PATADAY ) 0.2 % SOLN Put one drop in affected eye once a day for allergy symptom control 09/19/20   [name], Angela J, MD  ondansetron  (ZOFRAN -ODT) 4 MG disintegrating tablet Allow 1 tablet to dissolve by mouth every 8 hours as needed for nausea or vomiting. 10/19/21   Smoot, Lauraine LABOR, PA-C  ondansetron  (ZOFRAN -ODT) 4 MG disintegrating tablet 4mg  ODT q8 hours prn nausea/vomit 05/27/23   Palumbo, April, MD    Family History Family History  Problem Relation Age of Onset   Headache Brother     Social History Social History   Tobacco Use   Smoking status: Never    Passive exposure: Yes   Smokeless tobacco: Never  Vaping Use   Vaping status: Never Used     Allergies   Shellfish allergy   Review of Systems Review of Systems   Physical Exam Triage Vital Signs ED Triage Vitals  Encounter Vitals Group     BP 02/27/24 1711 (!) 142/82     Girls Systolic BP Percentile --      Girls Diastolic BP Percentile --      Boys Systolic BP Percentile --      Boys Diastolic BP Percentile --      Pulse Rate 02/27/24 1711 (!) 102     Resp 02/27/24 1711 18     Temp 02/27/24 1711 99.2 F (37.3 C)     Temp Source 02/27/24 1711 Oral  SpO2 02/27/24 1711 97 %     Weight 02/27/24 1708 143 lb 4.8 oz (65 kg)     Height --      Head Circumference --      Peak Flow --      Pain Score 02/27/24 1708 5     Pain Loc --      Pain Education --      Exclude from Growth Chart --    No data found.  Updated Vital Signs BP (!) 142/82 (BP Location: Right Arm)   Pulse (!) 102   Temp 99.2 F (37.3 C) (Oral)   Resp 18   Wt 143 lb 4.8 oz (65 kg)   SpO2 97%   BMI 22.44 kg/m   Visual Acuity Right Eye Distance:   Left Eye Distance:   Bilateral Distance:    Right Eye Near:   Left Eye Near:    Bilateral Near:     Physical Exam Vitals and nursing note reviewed.  Constitutional:      General: He is not in acute distress.    Appearance: Normal appearance. He is not  ill-appearing, toxic-appearing or diaphoretic.  HENT:     Right Ear: Tympanic membrane, ear canal and external ear normal.     Left Ear: Tympanic membrane, ear canal and external ear normal.     Nose: Congestion (moderately enlarged turbinates) present. No rhinorrhea.     Mouth/Throat:     Mouth: Mucous membranes are moist.     Pharynx: Oropharyngeal exudate and posterior oropharyngeal erythema (mild) present.  Eyes:     General: No scleral icterus. Cardiovascular:     Rate and Rhythm: Normal rate and regular rhythm.     Heart sounds: Normal heart sounds.  Pulmonary:     Effort: Pulmonary effort is normal. No respiratory distress.     Breath sounds: Normal breath sounds. No wheezing or rhonchi.  Musculoskeletal:     Cervical back: Tenderness present.  Lymphadenopathy:     Cervical: No cervical adenopathy.  Skin:    General: Skin is warm.  Neurological:     Mental Status: He is alert and oriented to person, place, and time.  Psychiatric:        Mood and Affect: Mood normal.        Behavior: Behavior normal.      UC Treatments / Results  Labs (all labs ordered are listed, but only abnormal results are displayed) Labs Reviewed  POCT RAPID STREP A (OFFICE) - Normal  POC COVID19/FLU A&B COMBO - Normal    EKG   Radiology No results found.  Procedures Procedures (including critical care time)  Medications Ordered in UC Medications - No data to display  Initial Impression / Assessment and Plan / UC Course  I have reviewed the triage vital signs and the nursing notes.  Pertinent labs & imaging results that were available during my care of the patient were reviewed by me and considered in my medical decision making (see chart for details).   Final Clinical Impressions(s) / UC Diagnoses   Final diagnoses:  Throat pain  Acute otalgia, bilateral  Viral URI     Discharge Instructions      You are negative for COVID, flu, and strep.  Symptoms are most likely due  to a viral illness.  You may use instructions below to help with symptom relief.  You been diagnosed with a viral illness today. -Viruses have to run their course and medicines that are prescribed are  meant to help with symptoms. - With viruses usually feel poorly from 3 to 7 days with cough being the last symptoms to resolve.  -Cough can linger from days to weeks.  Antibiotics are not effective for viruses. -If your cough lasts more than 2 weeks and you are coughing so hard that you are vomiting or feel like you could pass out we need to follow-up with PCP for further testing and evaluation. -Rest, increase water intake, may use pseudoephedrine for nasal congestion, Delsym (dextromethorphan) or honey as needed for cough, and ibuprofen  and/or Tylenol  as directed on packaging for pain and fever. -If you have hypertension you should take Coricidin or other OTC meds approved for people with high blood pressure. -You may use a spoonful of honey every 4-6 hours as needed for throat pain and cough. -Warm tea with honey and lemon are helpful for soothe throat as well.  Chloraseptic and Cepacol make a throat lozenge with numbing medication, can be purchased over-the-counter. -May also use Flonase  or sinus rinse for sinus pressure or nasal congestion.  Be sure to use distilled bottled water for sinus rinses. -May use coolmist humidifier to open up nasal passages -May elevate head to assist with postnasal drainage. -If you feel poorly (fever, fatigue, shortness of breath, nausea, etc.) for more than 10 days to be sure to follow-up with PCP or in clinic for further evaluation and additional treatments. If you experience chest pain with shortness of breath or pulse oxygen  less than 95% you should report to the ER.      ED Prescriptions   None    PDMP not reviewed this encounter.   Andra Corean BROCKS, PA-C 02/27/24 [phone]

## 2024-02-27 NOTE — Discharge Instructions (Signed)
 You are negative for COVID, flu, and strep.  Symptoms are most likely due to a viral illness.  You may use instructions below to help with symptom relief.  You been diagnosed with a viral illness today. -Viruses have to run their course and medicines that are prescribed are meant to help with symptoms. - With viruses usually feel poorly from 3 to 7 days with cough being the last symptoms to resolve.  -Cough can linger from days to weeks.  Antibiotics are not effective for viruses. -If your cough lasts more than 2 weeks and you are coughing so hard that you are vomiting or feel like you could pass out we need to follow-up with PCP for further testing and evaluation. -Rest, increase water intake, may use pseudoephedrine for nasal congestion, Delsym (dextromethorphan) or honey as needed for cough, and ibuprofen  and/or Tylenol  as directed on packaging for pain and fever. -If you have hypertension you should take Coricidin or other OTC meds approved for people with high blood pressure. -You may use a spoonful of honey every 4-6 hours as needed for throat pain and cough. -Warm tea with honey and lemon are helpful for soothe throat as well.  Chloraseptic and Cepacol make a throat lozenge with numbing medication, can be purchased over-the-counter. -May also use Flonase  or sinus rinse for sinus pressure or nasal congestion.  Be sure to use distilled bottled water for sinus rinses. -May use coolmist humidifier to open up nasal passages -May elevate head to assist with postnasal drainage. -If you feel poorly (fever, fatigue, shortness of breath, nausea, etc.) for more than 10 days to be sure to follow-up with PCP or in clinic for further evaluation and additional treatments. If you experience chest pain with shortness of breath or pulse oxygen  less than 95% you should report to the ER.

## 2024-03-08 ENCOUNTER — Ambulatory Visit (HOSPITAL_COMMUNITY): Payer: Self-pay
